# Patient Record
Sex: Male | Born: 1980 | Race: Black or African American | Hispanic: No | Marital: Single | Smoking: Never smoker
Health system: Southern US, Community
[De-identification: ages and names within clinical notes are randomized; demographics above are authoritative.]

## PROBLEM LIST (undated history)

## (undated) HISTORY — PX: WRIST SURGERY: SHX841

---

## 2012-03-14 ENCOUNTER — Emergency Department (HOSPITAL_COMMUNITY): Payer: No Typology Code available for payment source

## 2012-03-14 ENCOUNTER — Emergency Department (HOSPITAL_COMMUNITY)
Admission: EM | Admit: 2012-03-14 | Discharge: 2012-03-14 | Disposition: A | Discharge status: Home / Self Care | Payer: No Typology Code available for payment source | Attending: Emergency Medicine | Admitting: Emergency Medicine

## 2012-03-14 ENCOUNTER — Encounter (HOSPITAL_COMMUNITY): Payer: Self-pay | Admitting: *Deleted

## 2012-03-14 DIAGNOSIS — M25539 Pain in unspecified wrist: Secondary | ICD-10-CM | POA: Insufficient documentation

## 2012-03-14 DIAGNOSIS — M25562 Pain in left knee: Secondary | ICD-10-CM

## 2012-03-14 DIAGNOSIS — M25569 Pain in unspecified knee: Secondary | ICD-10-CM | POA: Insufficient documentation

## 2012-03-14 DIAGNOSIS — Y92410 Unspecified street and highway as the place of occurrence of the external cause: Secondary | ICD-10-CM | POA: Insufficient documentation

## 2012-03-14 DIAGNOSIS — M25532 Pain in left wrist: Secondary | ICD-10-CM

## 2012-03-14 MED ORDER — IBUPROFEN 800 MG PO TABS: 800.0000 mg | ORAL_TABLET | Freq: Three times a day (TID) | ORAL | Status: AC
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
## 2014-11-14 ENCOUNTER — Encounter (HOSPITAL_BASED_OUTPATIENT_CLINIC_OR_DEPARTMENT_OTHER): Payer: Self-pay | Admitting: Emergency Medicine

## 2014-11-14 ENCOUNTER — Emergency Department (HOSPITAL_BASED_OUTPATIENT_CLINIC_OR_DEPARTMENT_OTHER): Payer: Self-pay

## 2014-11-14 ENCOUNTER — Emergency Department (HOSPITAL_BASED_OUTPATIENT_CLINIC_OR_DEPARTMENT_OTHER)
Admission: EM | Admit: 2014-11-14 | Discharge: 2014-11-15 | Disposition: A | Discharge status: Home / Self Care | Payer: Self-pay | Attending: Emergency Medicine | Admitting: Emergency Medicine

## 2014-11-14 DIAGNOSIS — Y998 Other external cause status: Secondary | ICD-10-CM | POA: Insufficient documentation

## 2014-11-14 DIAGNOSIS — S86011A Strain of right Achilles tendon, initial encounter: Secondary | ICD-10-CM | POA: Insufficient documentation

## 2014-11-14 DIAGNOSIS — Y9389 Activity, other specified: Secondary | ICD-10-CM | POA: Insufficient documentation

## 2014-11-14 DIAGNOSIS — W1839XA Other fall on same level, initial encounter: Secondary | ICD-10-CM | POA: Insufficient documentation

## 2014-11-14 DIAGNOSIS — Y9289 Other specified places as the place of occurrence of the external cause: Secondary | ICD-10-CM | POA: Insufficient documentation

## 2014-11-14 MED ORDER — OXYCODONE-ACETAMINOPHEN 5-325 MG PO TABS
1.0000 | ORAL_TABLET | ORAL | Status: AC | PRN
Start: 2014-11-14 — End: ?

## 2014-11-14 NOTE — Discharge Instructions (Signed)
Take acetaminophen, ibuprofen, or naproxen as needed for pain, reserving oxycodone-acetaminophen for severe pain. No weightbearing until told it is allowable by the orthopedic physician.  Partial or Complete Achilles Tendon Rupture, with Phase I Rehab A complete tear in the Achilles tendon is known as an Achilles tendon rupture. The Achilles tendon, also known as the heel cord, connects the large calf muscles (gastrocnemius and soleus) to the heel bone (calcaneus) and is essential for proper functioning of the calf muscles. The calf muscles are required for pushing the foot downward and are necessary for walking, running, and jumping. SYMPTOMS  "Pop" or rip heard or felt at the back of the heel at the time of injury.  Pain and weakness when moving the foot (especially when pushing down with the front of the foot). If you have ruptured the tendon completely, you will not be able to rise on your toes on the injured leg. The pain can sometimes be severe. With a partial rupture, you may still be able to move your foot, and you may experience only minor pain and swelling.  Tenderness, swelling, warmth, and redness around the Achilles tendon.  Bruising at the Achilles tendon and heel after 48 hours. CAUSES 1. Achilles tendon rupture is most commonly caused by a sudden force placed upon the Achilles tendon that is greater than the tendon can withstand (jumping, hurdling, or sprinting). 2. Achilles tendon rupture may also occur from direct trauma or injury to the lower leg, foot, or ankle. RISK INCREASES WITH: 1. Sports that require sudden, explosive muscle contraction, such as those involving jumping and quick starts, and running or contact sports. 2. Poor strength and flexibility. 3. Previous Achilles tendon injury. 4. Untreated Achilles tendinitis. 5. Corticosteroid injection into the Achilles tendon. 6. Medical conditions, such as decreased circulation due to any cardiovascular medical problem or  obesity. PREVENTION 1. Warm up and stretch properly before activity. 2. Allow for adequate rest and recovery between activities. 3. Maintain physical fitness: 1. Ankle and leg flexibility. 2. Muscle strength and endurance. 3. Cardiovascular fitness. 4. Taping, protective strapping, or an adhesive bandage may be recommended before practice or competition. PROGNOSIS If treated properly, Achilles tendon ruptures are usually curable in 4 to 9 months. RELATED COMPLICATIONS  1. Weakness of the calf muscles can occur, especially if the rupture goes untreated. 2. Repeat rupture of the tendon is possible even after treatment. 3. Prolonged disability can occur. 4. Risks of surgery include infection, bleeding, injury to nerves, and impaired wound healing. TREATMENT 1. Initial treatment involves removing all weight from the affected leg. Ice, medication, compression bandages, and elevation of the leg may be used to help reduce pain and inflammation. 2. Definitive treatment options include: surgical treatment for a complete tear, and nonsurgical treatment for a partial tear. The return to sports is usually about the same with either treatment course but can occur a few weeks sooner with surgery. 3. Nonsurgical treatment can be used only for a partial rupture of the tendon. The affected leg is placed in a long cast (from foot to groin) to immobilize the injured tendon and allow for healing. The leg is kept in a cast for 4 to 9 weeks followed by immobilization in a walking boot for an additional 4 to 12 weeks. After immobilization, strengthening and stretching exercises are recommended to regain strength and a full range of motion. Nonsurgical treatment does not involve the risks associated with surgery (infection, bleeding, or nerve injury). However, the recovery period is usually longer  than with surgical intervention, and there is a higher risk of repeat rupture of the tendon. 4. Medication 1. If pain  medication is necessary, nonsteroidal anti-inflammatory medications, such as aspirin and ibuprofen, or other minor pain relievers, such as acetaminophen, are often recommended. 2. Do not take pain medication for 7 days before surgery. 3. Prescription pain relievers may be given by a caregiver. Use only as directed and only as much as you need. 5. Surgical treatment is performed to reattach the tendon to the heel bone (calcaneus) with sutures. After surgery, the lower leg and foot are immobilized in a cast. After immobilization, strengthening and stretching exercises are recommended to regain strength and a full range of motion. The advantages of surgical intervention are shorter recovery, no need to immobilize the knee, lower risk of repeat rupture, and a stronger calf muscle after injury. The disadvantages include the risks of surgery, such as impaired wound healing, nerve injury, and infection. SEEK IMMEDIATE MEDICAL CARE IF: 1. Pain increases, despite treatment. 2. Cast discomfort develops. 3. New, unexplained symptoms develop (drugs used in treatment may produce side effects). EXERCISES RANGE OF MOTION (ROM) AND STRETCHING EXERCISES - Achilles Tendon Rupture Phase I These exercises will help you begin to restore your ankle flexibility in the first 2 to 4 weeks after your cast is removed. Your physician, physical therapist, or athletic trainer will progress your exercise once you have demonstrated sufficient gains in flexibility and strength. While completing these exercises, remember: 1. Restoring tissue flexibility helps normal motion to return to the joints. This allows healthier, less painful movement and activity. 2. An effective stretch should be held for at least 30 seconds. 3. A stretch should never be painful. You should only feel a gentle lengthening or release in the stretched tissue. RANGE OF MOTION - Dorsi/Plantar Flexion  While sitting with your right / left knee straight, draw the  top of your foot upwards by flexing your ankle. Then reverse the motion, pointing your toes downward.  Hold each position for __________ seconds.  After completing your first set of exercises, repeat this exercise with your knee bent. Repeat __________ times. Complete this exercise __________ times per day. RANGE OF MOTION - Ankle Alphabet  Imagine your __________ big toe is a pen.  Keeping your hip and knee still, write out the entire alphabet with your "pen." Make the letters as large as you can without increasing any discomfort. Repeat __________ times. Complete this exercise __________ times per day. RANGE OF MOTION - Ankle Dorsiflexion, Active Assisted  Remove shoes and sit on a chair, preferably not on a carpeted surface.  Place your right / left foot under your knee. Extend your opposite leg for support.  Keeping your heel down, slide your right / left foot back toward the chair until you feel a stretch at your ankle or calf. If you do not feel a stretch, slide your bottom forward to the edge of the chair while still keeping your heel down.  Hold this stretch for __________ seconds. Repeat __________ times. Complete this stretch __________ times per day. STRETCH - Gastrocsoleus  Sit with your right / left leg extended. Holding onto both ends of a belt or towel, loop it around the ball of your foot.  Keeping your right / left ankle and foot relaxed and your knee straight, pull your foot and ankle toward you using the belt/towel.  You should feel a gentle stretch behind your calf or knee. Hold this position for __________ seconds. Repeat  __________ times. Complete this stretch __________ times per day. STRENGTHENING EXERCISES - Achilles Tendon Rupture Phase I These exercises will help you begin to restore your ankle strength the first 2 to 4 weeks after your cast is removed. Your physician, physical therapist, or athletic trainer will progress your exercise once you have  demonstrated sufficient gains in flexibility and strength. While completing these exercises, remember:  Muscles can gain both the endurance and the strength needed for everyday activities through controlled exercises.  Complete these exercises as instructed by your physician, physical therapist or athletic trainer. Progress the resistance and repetitions only as guided.  You may experience muscle soreness or fatigue, but the pain or discomfort you are trying to eliminate should never worsen during these exercises. If this pain does worsen, stop and make certain you are following the directions exactly. If the pain is still present after adjustments, discontinue the exercise until you can discuss the trouble with your clinician. STRENGTH - Dorsiflexors  Secure a rubber exercise band/tubing to a fixed object (table, pole) and loop the other end around your right / left foot.  Sit on the floor facing the fixed object. The band/tubing should be slightly tense when your foot is relaxed.  Slowly draw your foot back toward you using your ankle and toes.  Hold this position for __________ seconds. Slowly release the tension in the band and return your foot to the starting position. Repeat __________ times. Complete this exercise __________ times per day. STRENGTH - Plantar-flexors  Sit with your right / left leg extended. Holding onto both ends of a rubber exercise band/tubing, loop it around the ball of your foot. Keep a slight tension in the band.  Slowly push your toes away from you, pointing them downward.  Hold this position for __________ seconds. Return slowly, controlling the tension in the band/tubing. Repeat __________ times. Complete this exercise __________ times per day. STRENGTH - Towel Curls  Sit in a chair positioned on a non-carpeted surface.  Place your foot on a towel, keeping your heel on the floor.  Pull the towel toward your heel by only curling your toes. Keep your heel  on the floor.  If instructed by your physician, physical therapist, or athletic trainer, add weight to the end of the towel. Repeat __________ times. Complete this exercise __________ times per day. STRENGTH - Ankle Eversion  Secure one end of a rubber exercise band/tubing to a fixed object (table, pole). Loop the other end around your foot just before your toes.  Place your fists between your knees. This will focus your strengthening at your ankle.  Drawing the band/tubing across your opposite foot, slowly, pull your little toe out and up. Make sure the band/tubing is positioned to resist the entire motion.  Hold this position for __________ seconds.  Have your muscles resist the band/tubing as it slowly pulls your foot back to the starting position. Repeat __________ times. Complete this exercise __________ times per day. STRENGTH - Ankle Inversion  Secure one end of a rubber exercise band/tubing to a fixed object (table, pole). Loop the other end around your foot just before your toes.  Place your fists between your knees. This will focus your strengthening at your ankle.  Slowly pull your big toe up and in, making sure the band/tubing is positioned to resist the entire motion.  Hold this position for __________ seconds.  Have your muscles resist the band/tubing as it slowly pulls your foot back to the starting position. Repeat  __________ times. Complete this exercises __________ times per day. STRENGTH - Plantar Flexors, Seated  Sit on a chair that allows your feet to rest flat on the ground. If necessary, sit at the edge of the chair.  Keeping your toes firmly on the ground, lift your right / left heel as far as you can without increasing any discomfort in your ankle. Repeat __________ times. Complete this exercise __________ times a day. *If instructed by your physician, physical therapist, or athletic trainer, you may add ____________________ of resistance by placing a  weighted object on your right / left knee. Document Released: 03/09/2005 Document Revised: 12/24/2013 Document Reviewed: 11/21/2008 [facility] Patient Information 2015 [place], [place]. This information is not intended to replace advice given to you by your health care provider. Make sure you discuss any questions you have with your health care provider. Crutch Use Crutches are used to take weight off one of your legs or feet when you stand or walk. It is important to use crutches that fit properly. When fitted properly:  Each crutch should be 2-3 finger widths below the armpit.  Your weight should be supported by your hand, and not by resting the armpit on the crutch.  RISKS AND COMPLICATIONS Damage to the nerves that extend from your armpit to your hand and arm. To prevent this from happening, make sure your crutches fit properly and do not put pressure on your armpit when using them. HOW TO USE YOUR CRUTCHES If you have been instructed to use partial weight bearing, apply (bear) the amount of weight as your health care provider suggests. Do not bear weight in an amount that causes pain to the area of injury. Walking 3. Step with the crutches. 4. Swing the healthy leg slightly ahead of the crutches. Going Up Steps If there is no handrail: 7. Step up with the healthy leg. 8. Step up with the crutches and injured leg. 9. Continue in this way. If there is a handrail: 5. Hold both crutches in one hand. 6. Place your free hand on the handrail. 7. While putting your weight on your arms, lift your healthy leg to the step. 8. Bring the crutches and the injured leg up to that step. 9. Continue in this way. Going Down Steps Be very careful, as going down stairs with crutches is very challenging. If there is no handrail: 5. Step down with the injured leg and crutches. 6. Step down with the healthy leg. If there is a handrail: 6. Place your hand on the handrail. 7. Hold both crutches with your  free hand. 8. Lower your injured leg and crutch to the step below you. Make sure to keep the crutch tips in the center of the step, never on the edge. 9. Lower your healthy leg to that step. 10. Continue in this way. Standing Up 4. Hold the injured leg forward. 5. Grab the armrest with one hand and the top of the crutches with the other hand. 6. Using these supports, pull yourself up to a standing position. Sitting Down 4. Hold the injured leg forward. 5. Grab the armrest with one hand and the top of the crutches with the other hand. 6. Lower yourself to a sitting position. SEEK MEDICAL CARE IF:  You still feel unsteady on your feet.  You develop new pain, for example in your armpits, back, shoulder, wrist, or hip.  You develop any numbness or tingling. SEEK IMMEDIATE MEDICAL CARE IF: You fall. Document Released: 08/06/2000 Document Revised: 08/14/2013  Document Reviewed: 04/16/2013 [facility] Patient Information 2015 [place], [place]. This information is not intended to replace advice given to you by your health care provider. Make sure you discuss any questions you have with your health care provider.  Acetaminophen; Oxycodone tablets What is this medicine? ACETAMINOPHEN; OXYCODONE (a set a MEE noe fen; ox i KOE done) is a pain reliever. It is used to treat mild to moderate pain. This medicine may be used for other purposes; ask your health care provider or pharmacist if you have questions. COMMON BRAND NAME(S): Endocet, Magnacet, Narvox, Percocet, Perloxx, Primalev, Primlev, Roxicet, Xolox What should I tell my health care provider before I take this medicine? They need to know if you have any of these conditions: -brain tumor -Crohn's disease, inflammatory bowel disease, or ulcerative colitis -drug abuse or addiction -head injury -heart or circulation problems -if you often drink alcohol -kidney disease or problems going to the bathroom -liver disease -lung disease, asthma, or  breathing problems -an unusual or allergic reaction to acetaminophen, oxycodone, other opioid analgesics, other medicines, foods, dyes, or preservatives -pregnant or trying to get pregnant -breast-feeding How should I use this medicine? Take this medicine by mouth with a full glass of water. Follow the directions on the prescription label. Take your medicine at regular intervals. Do not take your medicine more often than directed. Talk to your pediatrician regarding the use of this medicine in children. Special care may be needed. Patients over 30 years old may have a stronger reaction and need a smaller dose. Overdosage: If you think you have taken too much of this medicine contact a poison control center or emergency room at once. NOTE: This medicine is only for you. Do not share this medicine with others. What if I miss a dose? If you miss a dose, take it as soon as you can. If it is almost time for your next dose, take only that dose. Do not take double or extra doses. What may interact with this medicine? -alcohol -antihistamines -barbiturates like amobarbital, butalbital, butabarbital, methohexital, pentobarbital, phenobarbital, thiopental, and secobarbital -benztropine -drugs for bladder problems like solifenacin, trospium, oxybutynin, tolterodine, hyoscyamine, and methscopolamine -drugs for breathing problems like ipratropium and tiotropium -drugs for certain stomach or intestine problems like propantheline, homatropine methylbromide, glycopyrrolate, atropine, belladonna, and dicyclomine -general anesthetics like etomidate, ketamine, nitrous oxide, propofol, desflurane, enflurane, halothane, isoflurane, and sevoflurane -medicines for depression, anxiety, or psychotic disturbances -medicines for sleep -muscle relaxants -naltrexone -narcotic medicines (opiates) for pain -phenothiazines like perphenazine, thioridazine, chlorpromazine, mesoridazine, fluphenazine, prochlorperazine,  promazine, and trifluoperazine -scopolamine -tramadol -trihexyphenidyl This list may not describe all possible interactions. Give your health care provider a list of all the medicines, herbs, non-prescription drugs, or dietary supplements you use. Also tell them if you smoke, drink alcohol, or use illegal drugs. Some items may interact with your medicine. What should I watch for while using this medicine? Tell your doctor or health care professional if your pain does not go away, if it gets worse, or if you have new or a different type of pain. You may develop tolerance to the medicine. Tolerance means that you will need a higher dose of the medication for pain relief. Tolerance is normal and is expected if you take this medicine for a long time. Do not suddenly stop taking your medicine because you may develop a severe reaction. Your body becomes used to the medicine. This does NOT mean you are addicted. Addiction is a behavior related to getting and using a drug  for a non-medical reason. If you have pain, you have a medical reason to take pain medicine. Your doctor will tell you how much medicine to take. If your doctor wants you to stop the medicine, the dose will be slowly lowered over time to avoid any side effects. You may get drowsy or dizzy. Do not drive, use machinery, or do anything that needs mental alertness until you know how this medicine affects you. Do not stand or sit up quickly, especially if you are an older patient. This reduces the risk of dizzy or fainting spells. Alcohol may interfere with the effect of this medicine. Avoid alcoholic drinks. There are different types of narcotic medicines (opiates) for pain. If you take more than one type at the same time, you may have more side effects. Give your health care provider a list of all medicines you use. Your doctor will tell you how much medicine to take. Do not take more medicine than directed. Call emergency for help if you have  problems breathing. The medicine will cause constipation. Try to have a bowel movement at least every 2 to 3 days. If you do not have a bowel movement for 3 days, call your doctor or health care professional. Do not take Tylenol (acetaminophen) or medicines that have acetaminophen with this medicine. Too much acetaminophen can be very dangerous. Many nonprescription medicines contain acetaminophen. Always read the labels carefully to avoid taking more acetaminophen. What side effects may I notice from receiving this medicine? Side effects that you should report to your doctor or health care professional as soon as possible: -allergic reactions like skin rash, itching or hives, swelling of the face, lips, or tongue -breathing difficulties, wheezing -confusion -light headedness or fainting spells -severe stomach pain -unusually weak or tired -yellowing of the skin or the whites of the eyes Side effects that usually do not require medical attention (report to your doctor or health care professional if they continue or are bothersome): -dizziness -drowsiness -nausea -vomiting This list may not describe all possible side effects. Call your doctor for medical advice about side effects. You may report side effects to FDA at 1-800-FDA-1088. Where should I keep my medicine? Keep out of the reach of children. This medicine can be abused. Keep your medicine in a safe place to protect it from theft. Do not share this medicine with anyone. Selling or giving away this medicine is dangerous and against the law. Store at room temperature between 20 and 25 degrees C (68 and 77 degrees F). Keep container tightly closed. Protect from light. This medicine may cause accidental overdose and death if it is taken by other adults, children, or pets. Flush any unused medicine down the toilet to reduce the chance of harm. Do not use the medicine after the expiration date. NOTE: This sheet is a summary. It may not cover  all possible information. If you have questions about this medicine, talk to your doctor, pharmacist, or health care provider.  2015, [employer]. (2013-04-02 13:17:35)

## 2014-11-14 NOTE — ED Notes (Signed)
Dr. Glick in to see pt 

## 2014-11-14 NOTE — ED Notes (Signed)
Patient states that he thinks he tore his achillies. Has had pain since yesterday

## 2014-11-14 NOTE — ED Provider Notes (Signed)
CSN: [id number]     Arrival date & time 11/14/14  2318 History  This chart was scribed for Dione Booze, MD by Roxy Cedar, ED Scribe. This patient was seen in room MH06/MH06 and the patient's care was started at 11:45 PM.   Chief Complaint  Patient presents with  . Ankle Pain   Patient is a 34 y.o. male presenting with ankle pain. The history is provided by the patient. No language interpreter was used.  Ankle Pain Location:  Ankle Time since incident:  1 day Injury: yes   Mechanism of injury: fall   Ankle location:  R ankle Pain details:    Quality:  Aching and shooting   Radiates to:  R leg   Severity:  Moderate   Onset quality:  Sudden   Timing:  Constant   Progression:  Unchanged Chronicity:  New  HPI Comments: Edward Irwin is a 34 y.o. male with a PMHx of wrist surgery, who presents to the Emergency Department complaining of right ankle pain that began yesterday. He reports pain radiates up the lateral aspect of his right calf. Patient suspects that he tore his achilles tendon. He currently rates his pain as 1-2/10 while laying down. He reports that pain is exacerbated by ambulating and putting weight on his right foot.  History reviewed. No pertinent past medical history. Past Surgical History  Procedure Laterality Date  . Wrist surgery      tendon repair   History reviewed. No pertinent family history. History  Substance Use Topics  . Smoking status: Never Smoker   . Smokeless tobacco: Never Used  . Alcohol Use: Yes     Comment: weekends   Review of Systems  Musculoskeletal: Positive for arthralgias.  All other systems reviewed and are negative.  Allergies  Review of patient's allergies indicates no known allergies.  Home Medications   Prior to Admission medications   Not on File   Triage Vitals: BP 116/81 mmHg  Pulse 64  Temp(Src) 98.3 F (36.8 C) (Oral)  Resp 16  Ht  (1.854 m)  Wt 210 lb (95.255 kg)  BMI 27.71 kg/m2  SpO2 100%  Physical  Exam  Constitutional: He is oriented to person, place, and time. He appears well-developed and well-nourished.  HENT:  Head: Normocephalic and atraumatic.  Eyes: EOM are normal. Pupils are equal, round, and reactive to light.  Neck: Normal range of motion. Neck supple. No JVD present.  Cardiovascular: Normal rate, regular rhythm and normal heart sounds.   No murmur heard. Pulmonary/Chest: Effort normal and breath sounds normal. He has no wheezes. He has no rales. He exhibits no tenderness.  Abdominal: Soft. Bowel sounds are normal. He exhibits no distension and no mass. There is no tenderness.  Musculoskeletal: Normal range of motion. He exhibits tenderness. He exhibits no edema.  Tenderness over the right achilles tendon. Positive Thompson test. Pain on passive dorsiflexion and plantar flexion against resistance.   Lymphadenopathy:    He has no cervical adenopathy.  Neurological: He is alert and oriented to person, place, and time. No cranial nerve deficit. Coordination normal.  Skin: Skin is warm and dry. No rash noted.  Psychiatric: He has a normal mood and affect. His behavior is normal. Judgment and thought content normal.  Nursing note and vitals reviewed.  ED Course  Procedures (including critical care time)  DIAGNOSTIC STUDIES: Oxygen Saturation is 100% on RA, normal by my interpretation.    COORDINATION OF CARE: 11:47 PM- Ordered diagnostic imaging of right  ankle. Discussed plans to give patient medication for pain management. Advised patient to use tylenol and ibuprofen as needed. Will refer patient to on call orthopedist to contact if symptoms worsen. Pt advised of plan for treatment and pt agrees.  Imaging Review Dg Ankle Complete Right  11/15/2014   CLINICAL DATA:  Right ankle popped at Achilles tendon while running. Initial encounter.  EXAM: RIGHT ANKLE - COMPLETE 3+ VIEW  COMPARISON:  None.  FINDINGS: There is no evidence of fracture or dislocation. The ankle mortise is  intact; the interosseous space is within normal limits. No talar tilt or subluxation is seen.  The joint spaces are preserved. No significant soft tissue abnormalities are seen. The Achilles tendon is difficult to fully assess on radiograph; there may be some degree of disruption along the more superior aspect of the Achilles tendon, given clinical concern.  IMPRESSION: 1. No evidence of fracture or dislocation. 2. Achilles tendon not well assessed on radiograph. There may be some degree of disruption along the more superior aspect of the Achilles tendon, given clinical concern. MRI would be helpful for further evaluation, as deemed clinically appropriate.   Electronically Signed   By: Roanna RaiderJeffery  Chang M.D.   On: 11/15/2014 00:00    Images viewed by me. MDM   Final diagnoses:  Partial tear of right Achilles tendon, initial encounter    Partial tear of right Achilles tendon. This is based on clinical evaluation. He is given crutches and advised not to bear weight until he is evaluated by orthopedics. He is discharged with prescription for oxycodone-acetaminophen, but advised to use over-the-counter analgesics as his primary painkillers.  I personally performed the services described in this documentation, which was scribed in my presence. The recorded information has been reviewed and is accurate.    Dione Boozeavid Robie Mcniel, MD 11/15/14 [phone]

## 2014-11-14 NOTE — ED Notes (Addendum)
Reports strained achilles tendon last night (yesterday) make a certain movement/ turning a certain way. "doesn't really hurt while I'm just lying here". (denies: foot ankle, heal or shin pain), pinpoints pain to achilles and up to mid high calf. CMS intact. ROM intact. Has iced area PTA.

## 2015-08-28 IMAGING — CR DG ANKLE COMPLETE 3+V*R*
3 series · 3 of 3 positions shown · non-contrast
Comparison: None.

CLINICAL DATA: Right ankle popped at Achilles tendon while running.
Initial encounter.

EXAM:
RIGHT ANKLE - COMPLETE 3+ VIEW

[t ankle joint ap right]
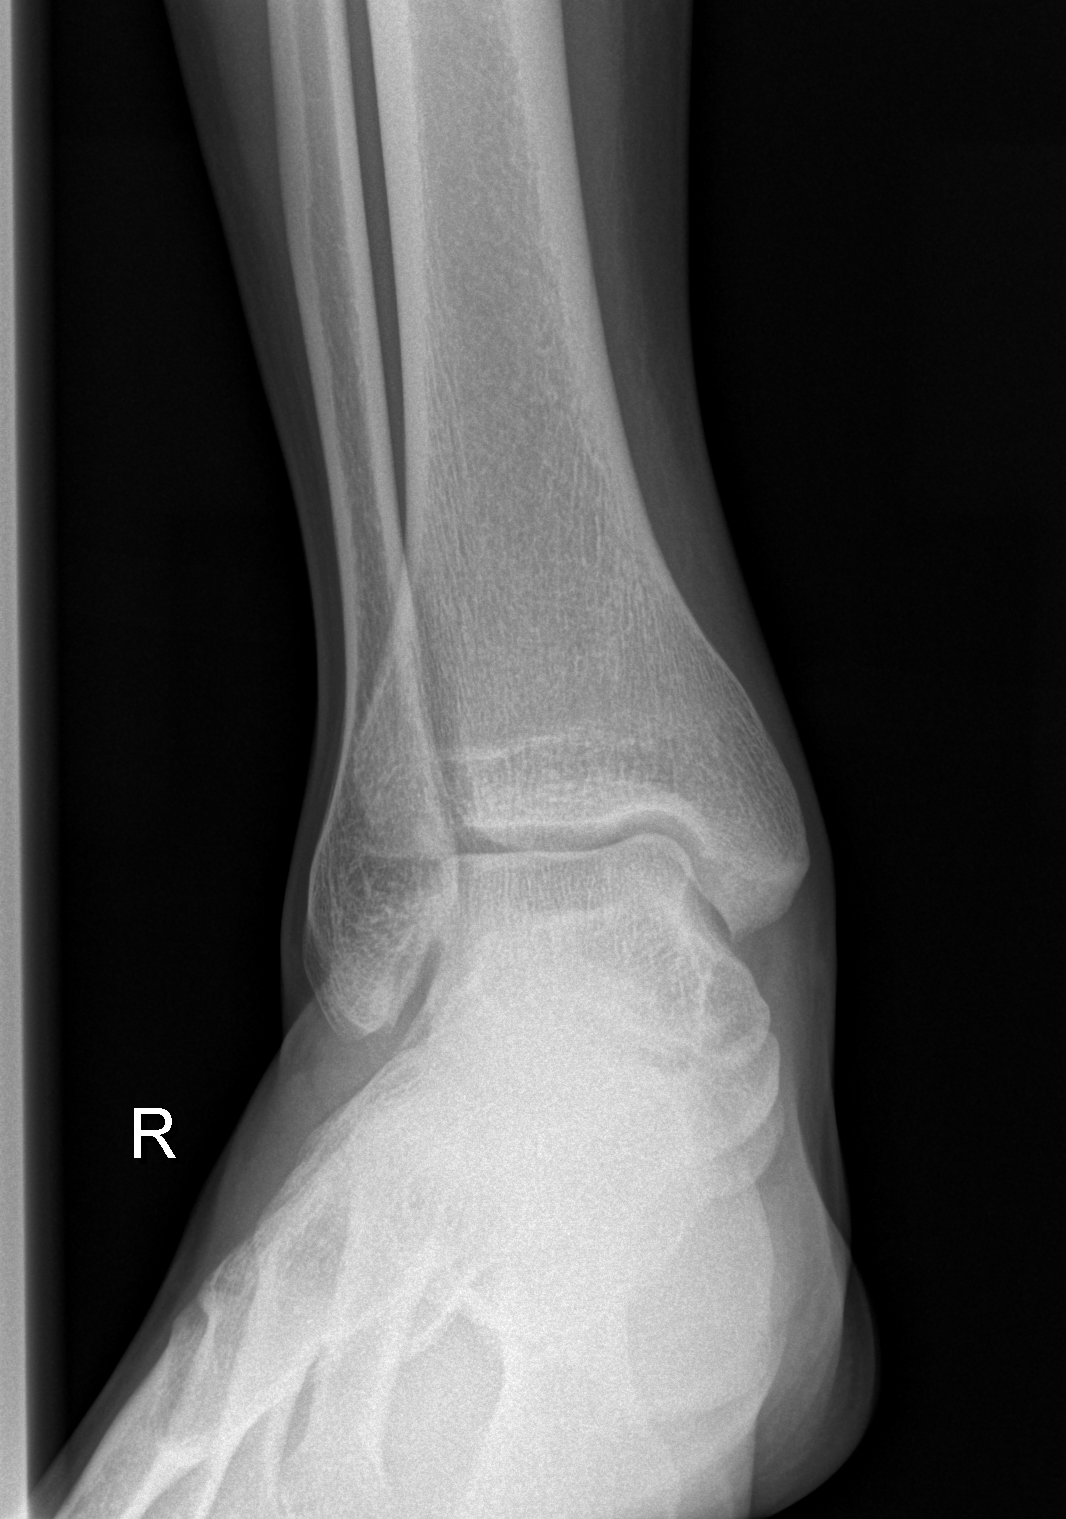

[t ankle joint oblique right]
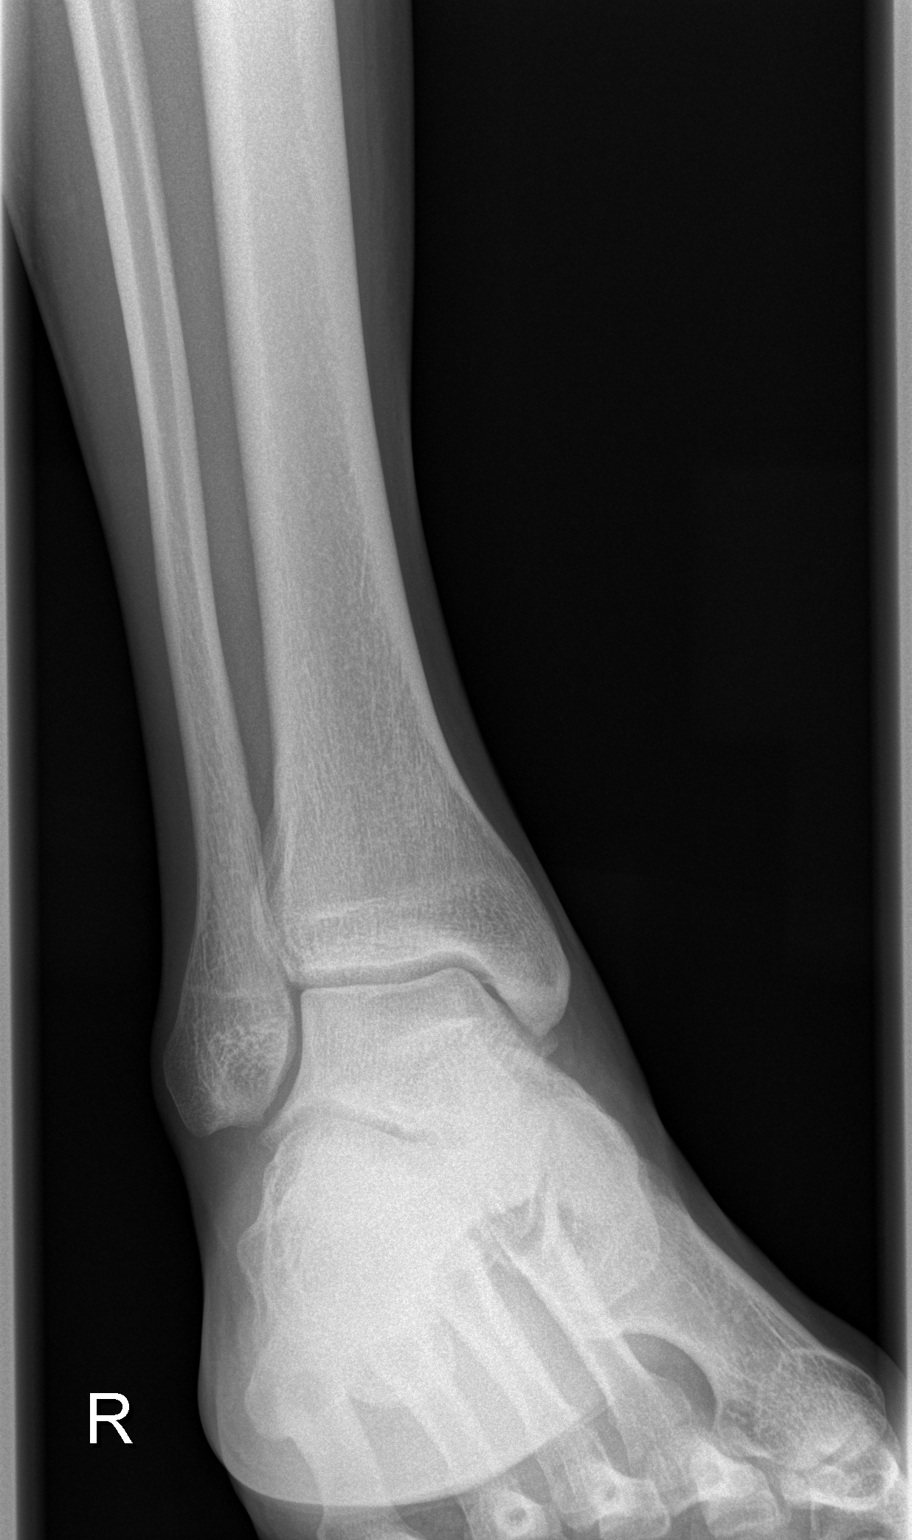

[t ankle joint lat right]
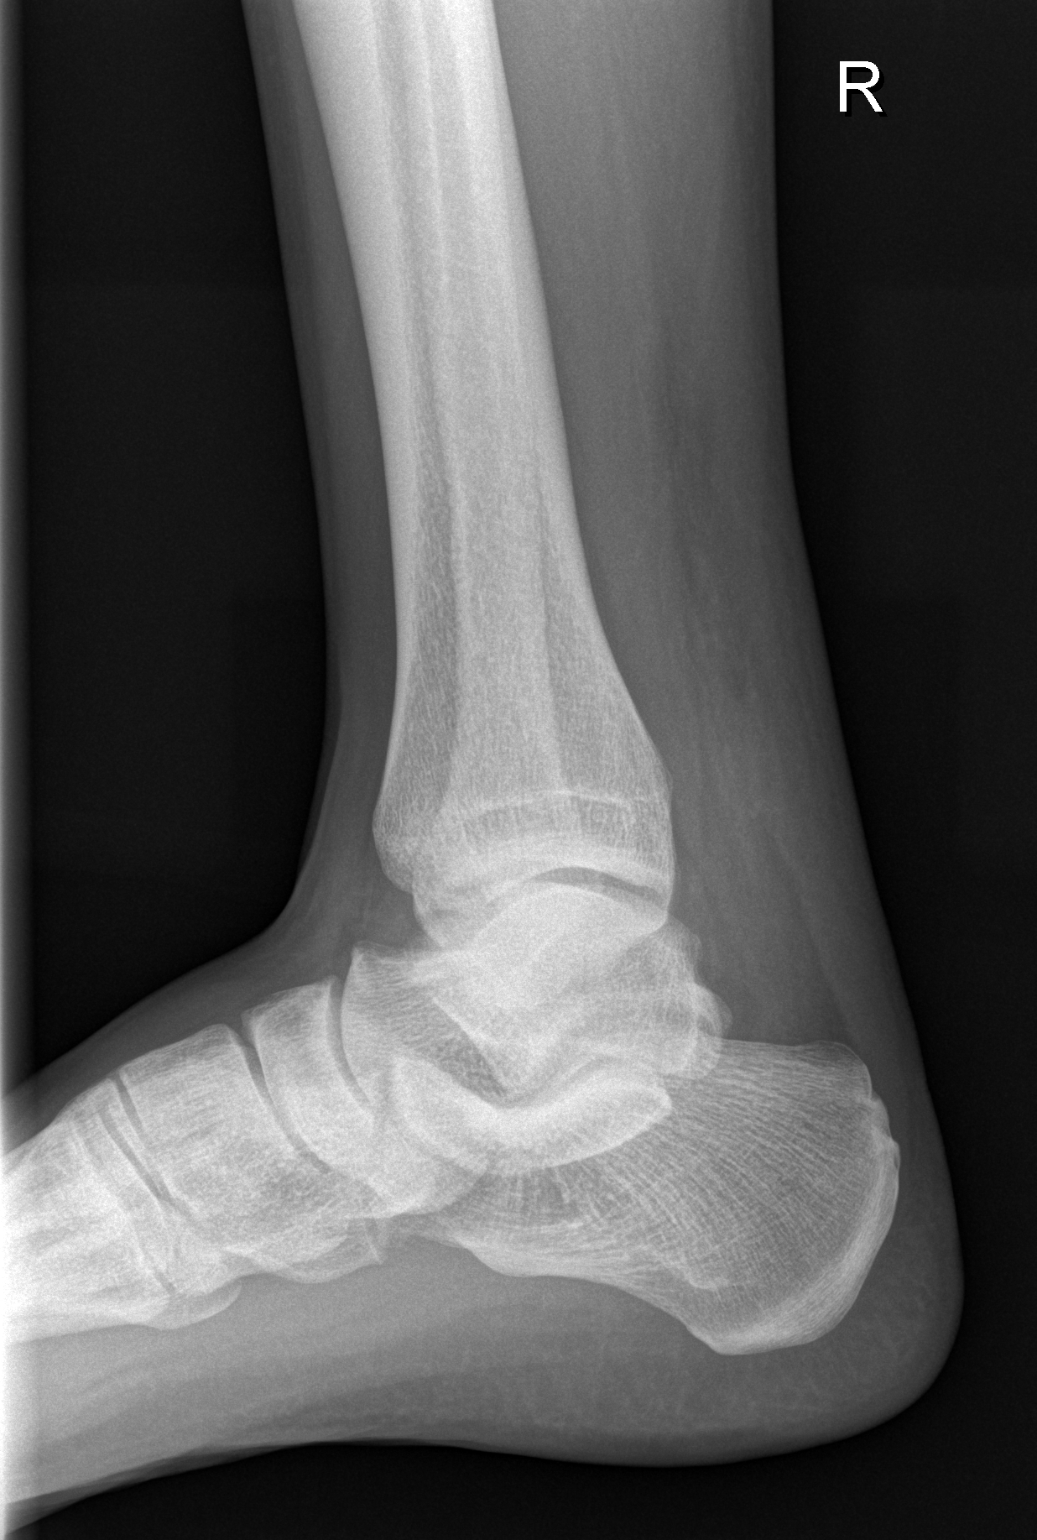

[3 of 3 positions shown; findings below may reference images not displayed]

FINDINGS: There is no evidence of fracture or dislocation. The ankle mortise
is intact; the interosseous space is within normal limits. No talar
tilt or subluxation is seen.

The joint spaces are preserved. No significant soft tissue
abnormalities are seen. The Achilles tendon is difficult to fully
assess on radiograph; there may be some degree of disruption along
the more superior aspect of the Achilles tendon, given clinical
concern.
IMPRESSION: 1. No evidence of fracture or dislocation.
2. Achilles tendon not well assessed on radiograph. There may be
some degree of disruption along the more superior aspect of the
Achilles tendon, given clinical concern. MRI would be helpful for
further evaluation, as deemed clinically appropriate.
